# Patient Record
Sex: Male | Born: 1985
Health system: Southern US, Community
[De-identification: ages and names within clinical notes are randomized; demographics above are authoritative.]

---

## 2005-04-19 ENCOUNTER — Emergency Department (HOSPITAL_COMMUNITY): Admission: EM | Admit: 2005-04-19 | Discharge: 2005-04-19 | Payer: Self-pay | Admitting: Emergency Medicine

## 2006-07-29 IMAGING — CR DG WRIST COMPLETE 3+V*R*
3 series · 3 of 3 positions shown · non-contrast
Comparison: None.

CLINICAL DATA: Pain over the right anatomical snuffbox after trauma on 04/13/05.
 RIGHT WRIST ? 4 VIEWS:

[view not recorded (1 of 3)]
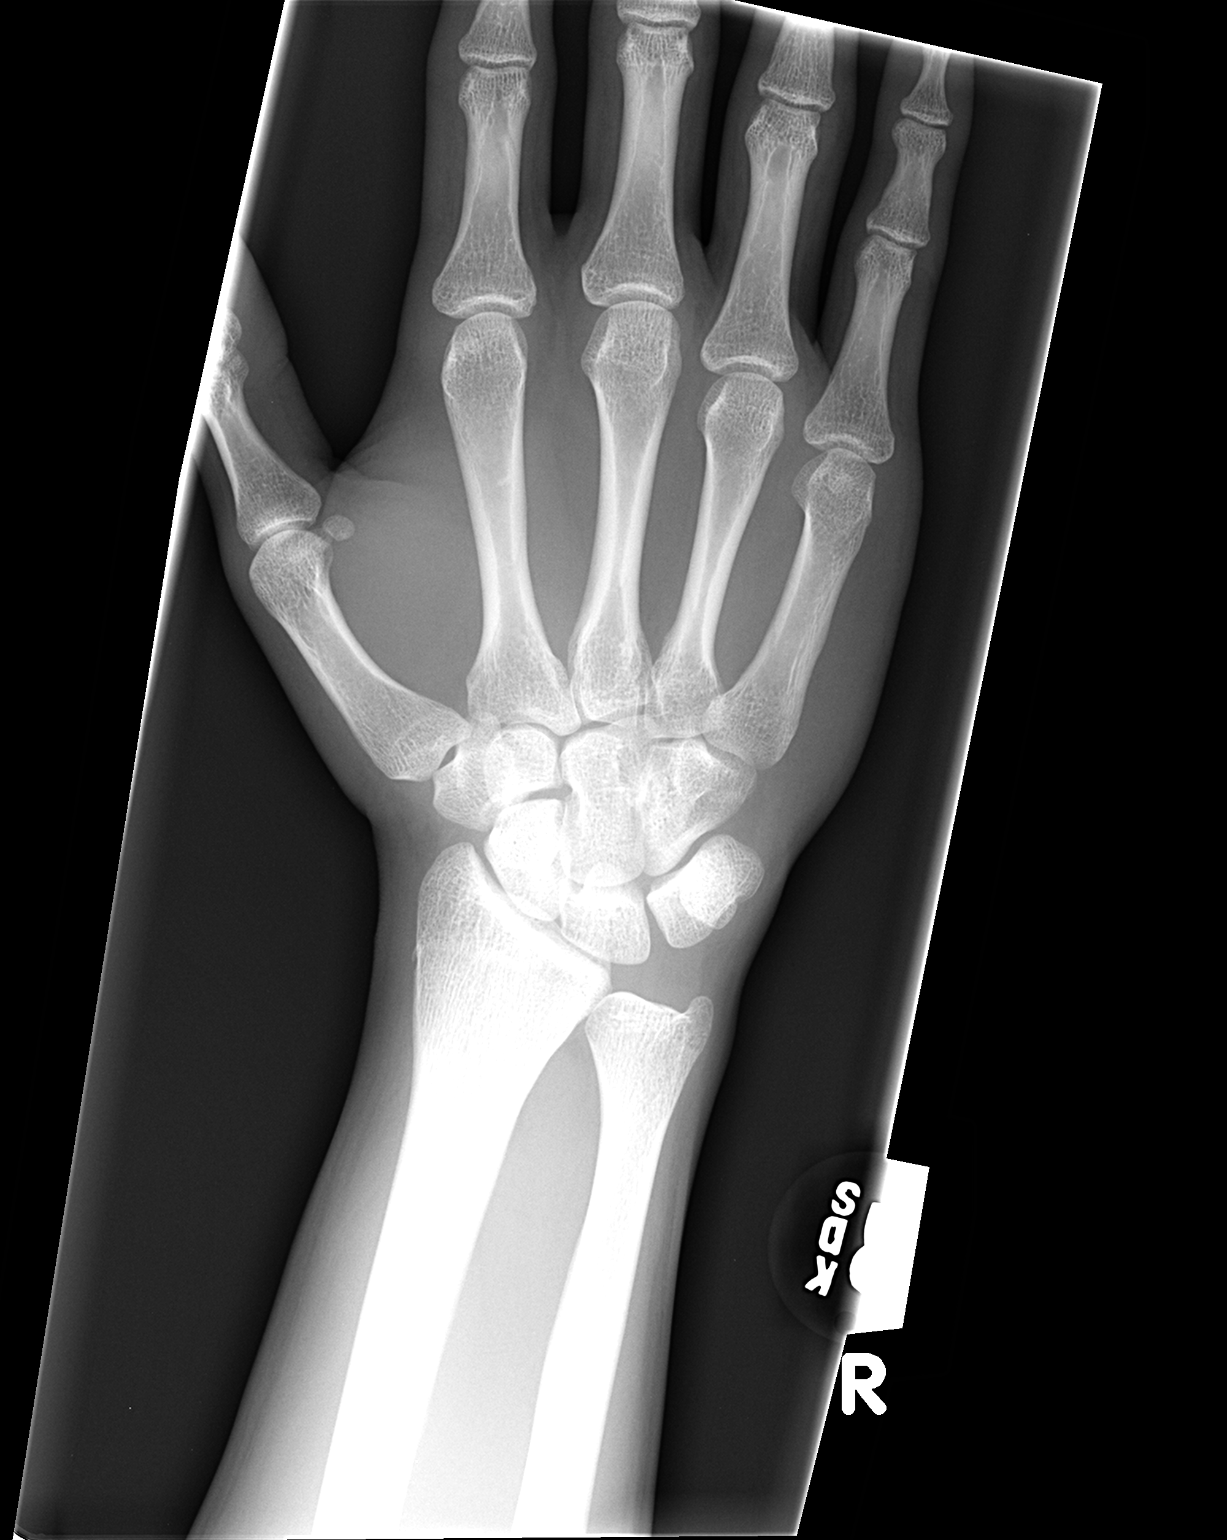

[view not recorded (2 of 3)]
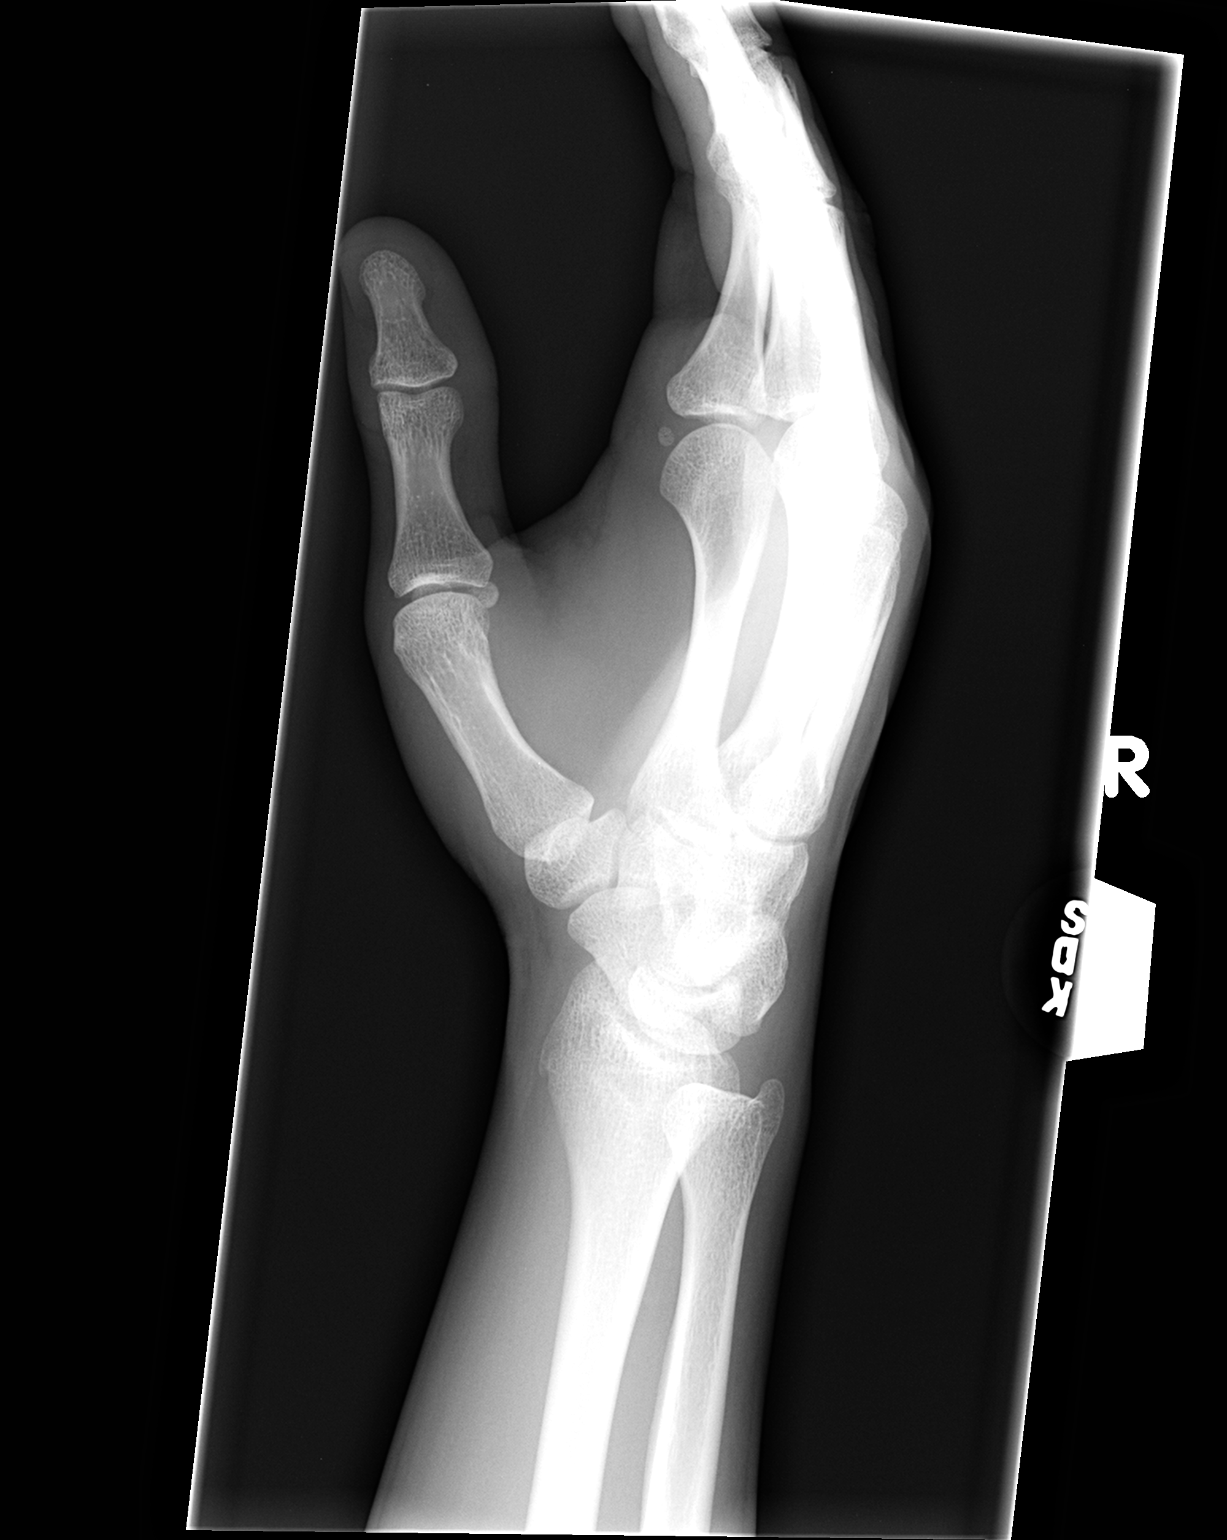

[view not recorded (3 of 3)]
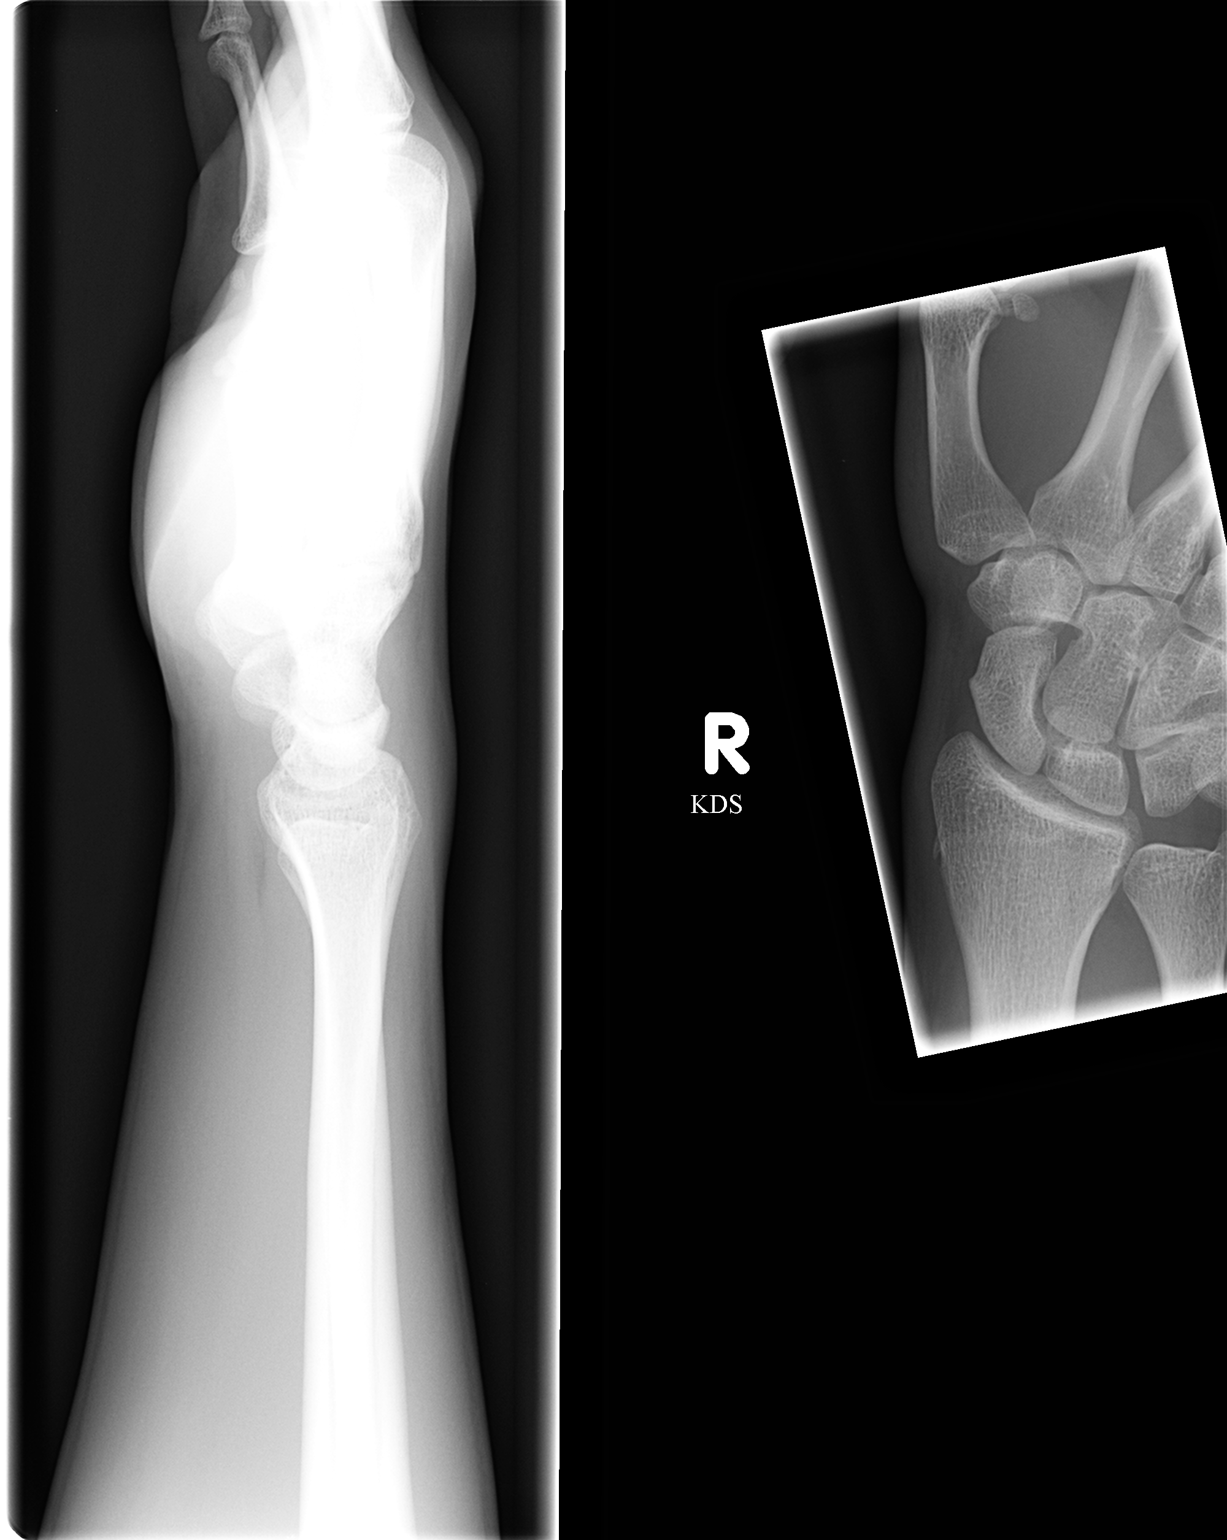

[3 of 3 positions shown; findings below may reference images not displayed]

FINDINGS: There is no evidence of fracture or dislocation.  There is no evidence of arthropathy or other focal bone abnormality.  Soft tissues are unremarkable.
IMPRESSION: Negative.

## 2009-11-17 ENCOUNTER — Emergency Department (HOSPITAL_COMMUNITY): Admission: EM | Admit: 2009-11-17 | Discharge: 2009-11-17 | Payer: Self-pay | Admitting: Emergency Medicine

## 2011-02-26 IMAGING — CR DG ANKLE COMPLETE 3+V*R*
3 series · 3 of 3 positions shown · non-contrast
Comparison: None.

CLINICAL DATA: Ankle pain and swelling following twisting injury.

RIGHT ANKLE - COMPLETE 3+ VIEW

[view not recorded (1 of 3)]
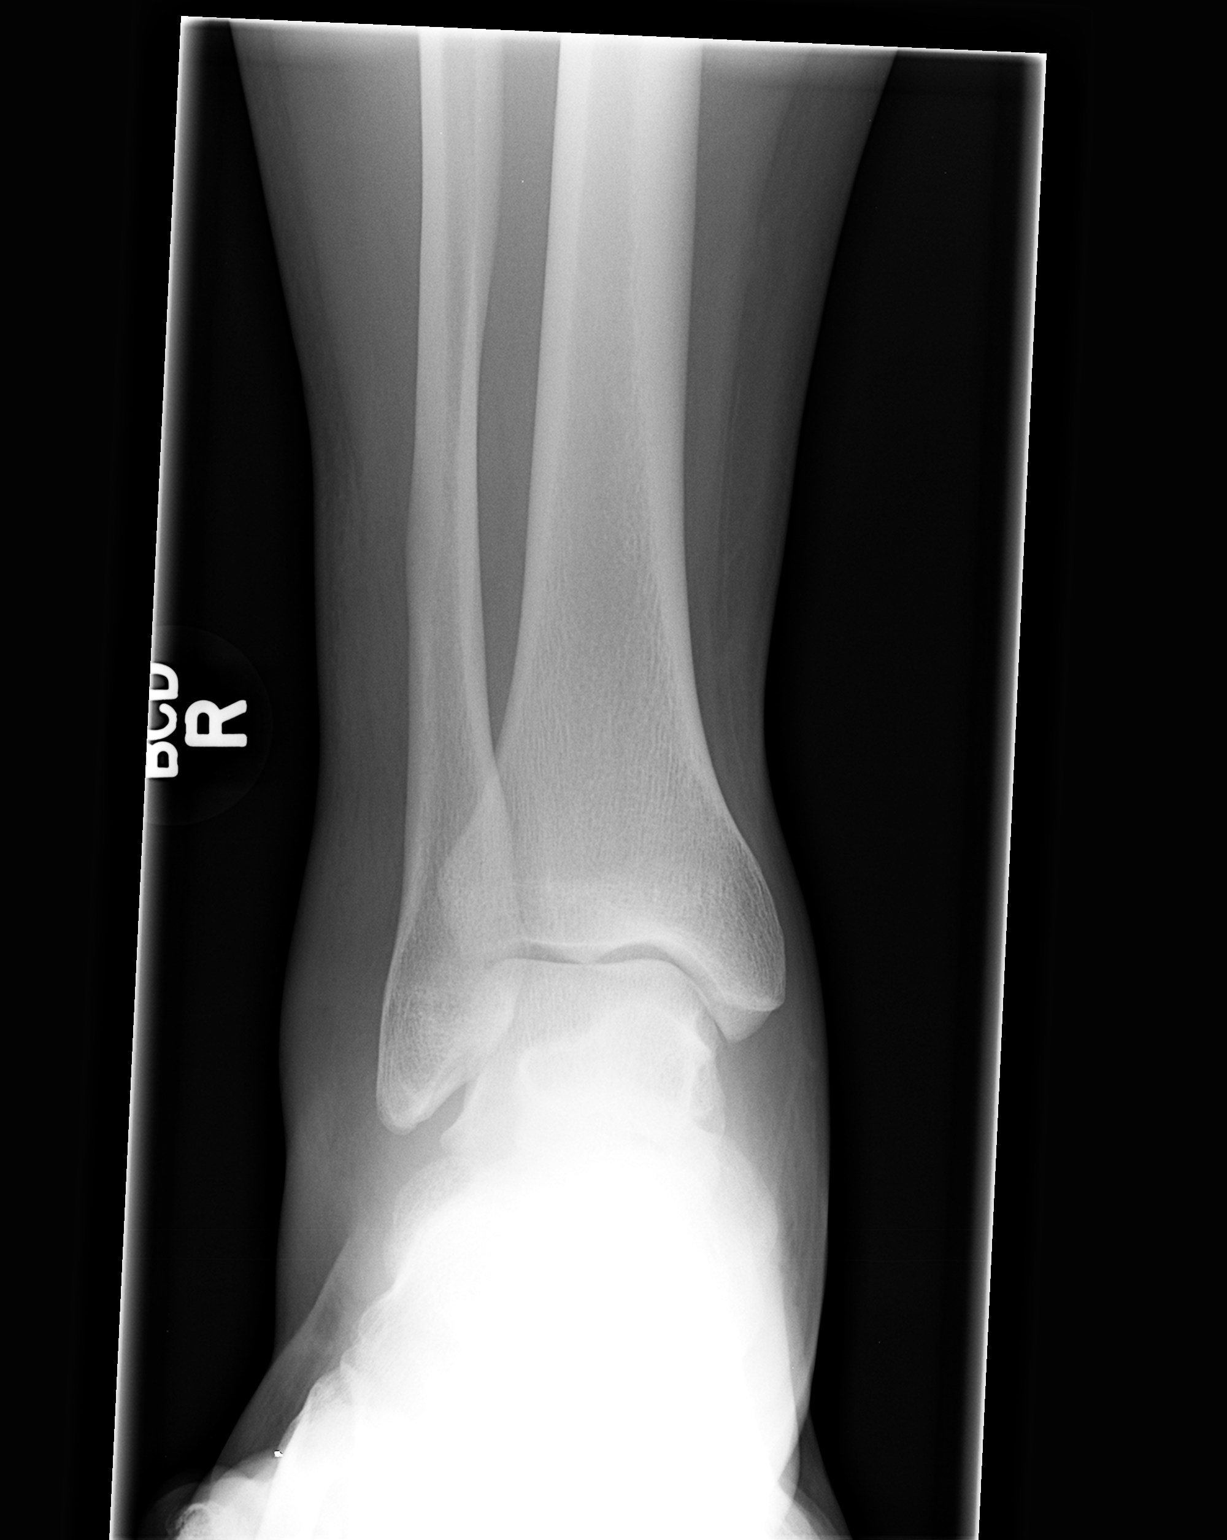

[view not recorded (2 of 3)]
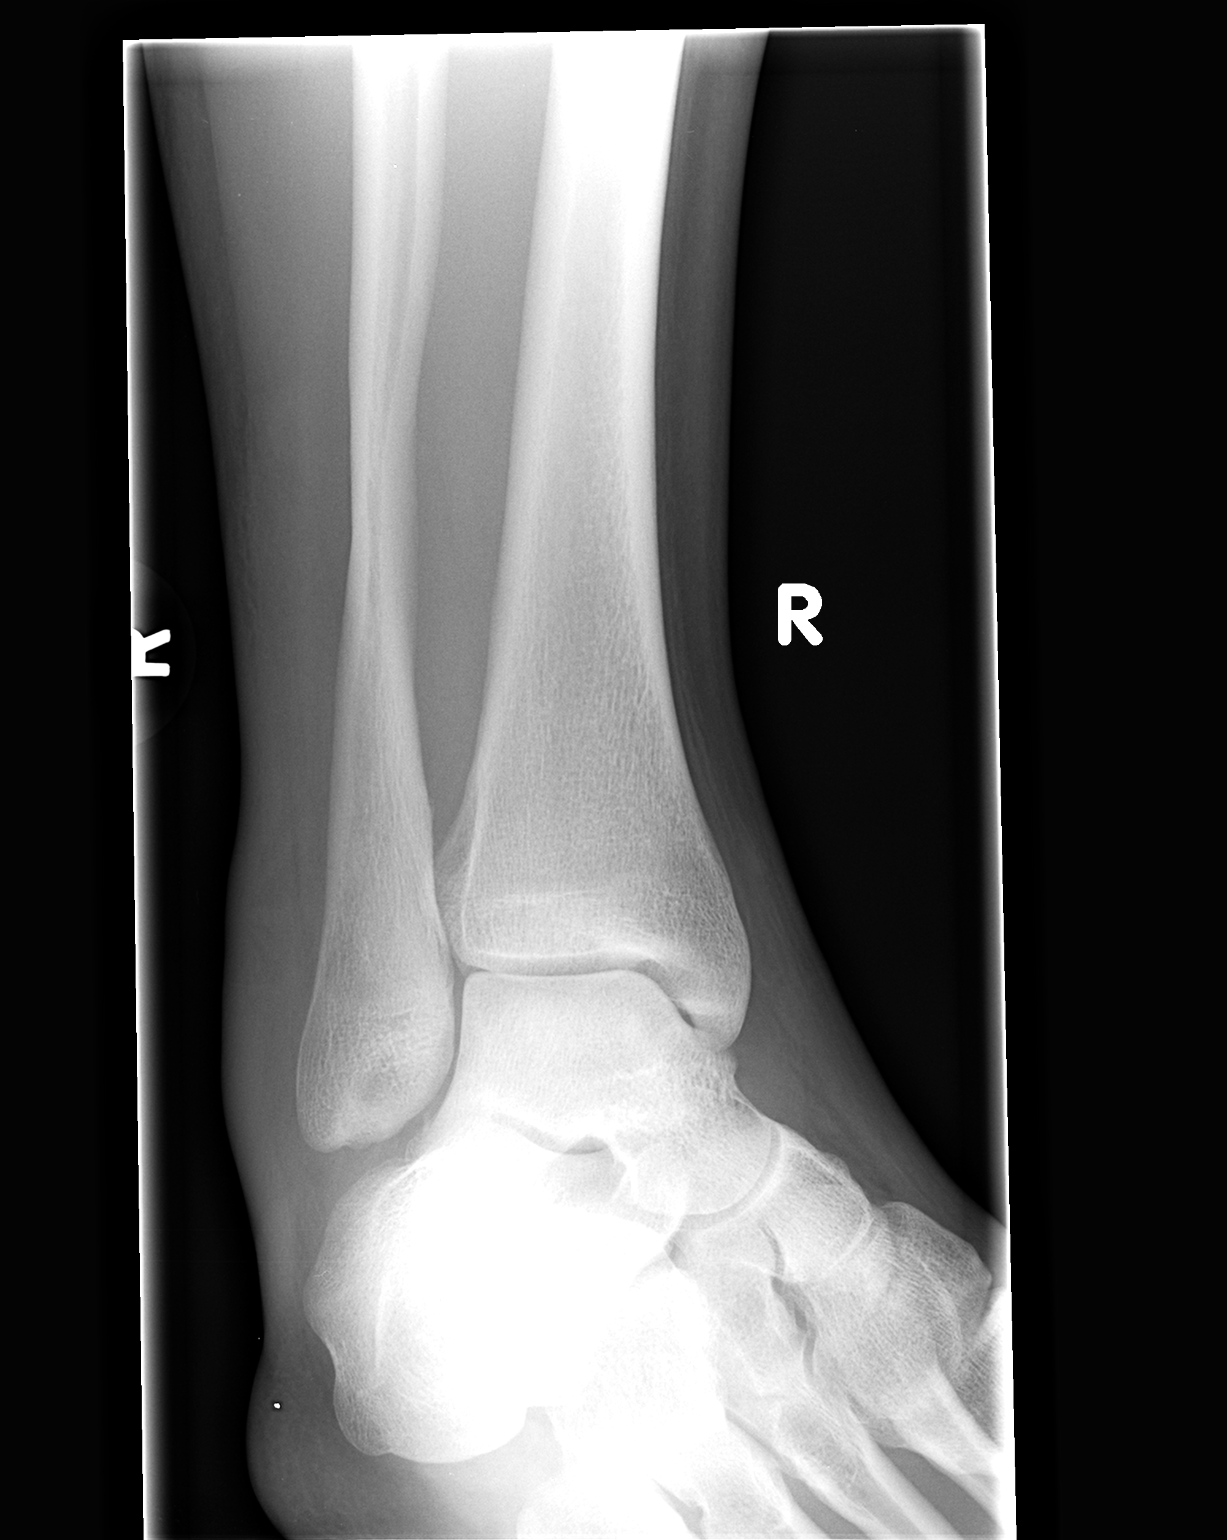

[view not recorded (3 of 3)]
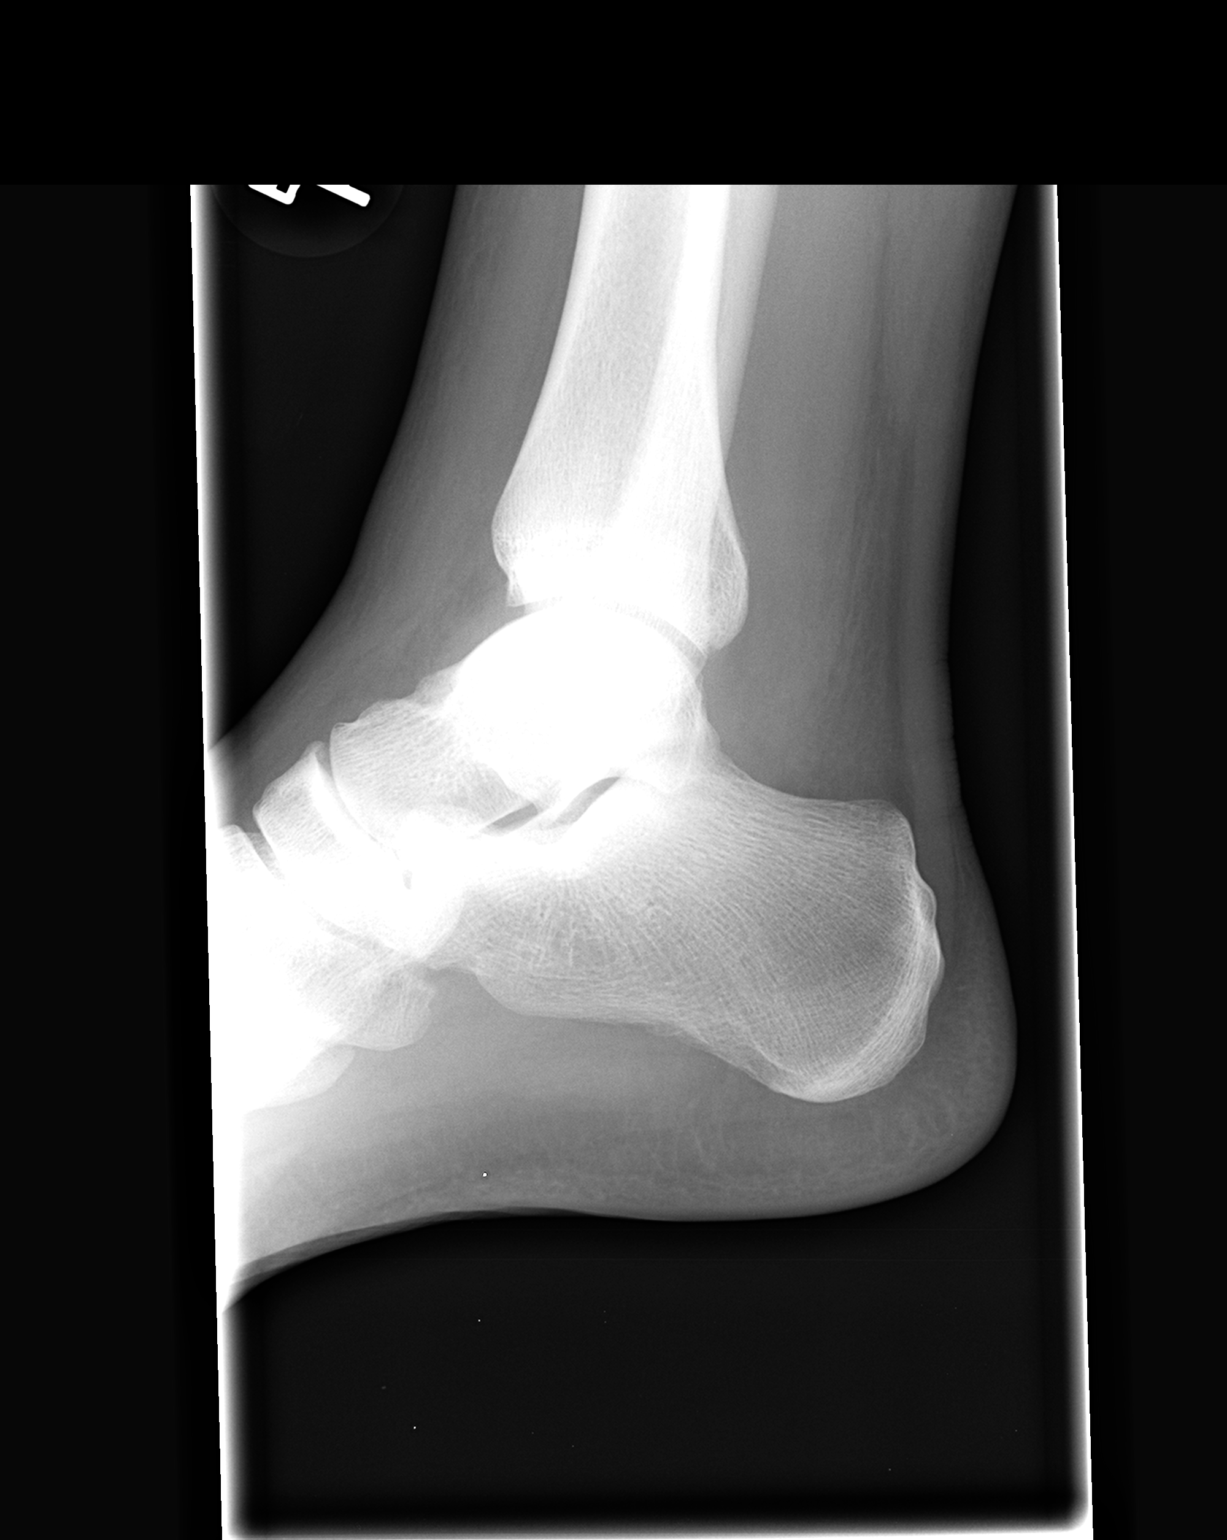

[3 of 3 positions shown; findings below may reference images not displayed]

FINDINGS: There is marked lateral soft tissue swelling.
Mineralization and alignment are normal.  There is no evidence of
acute fracture or dislocation.  There is no widening of the ankle
mortise.
IMPRESSION: Lateral soft tissue swelling.  No acute osseous findings.

## 2013-03-04 ENCOUNTER — Ambulatory Visit: Payer: Self-pay | Admitting: Gastroenterology

## 2013-03-04 ENCOUNTER — Telehealth: Payer: Self-pay | Admitting: Gastroenterology

## 2013-03-04 NOTE — Telephone Encounter (Signed)
Pt was a no show

## 2022-01-25 ENCOUNTER — Encounter: Payer: Self-pay | Admitting: Emergency Medicine

## 2022-01-25 ENCOUNTER — Observation Stay (HOSPITAL_COMMUNITY)
Admission: EM | Admit: 2022-01-25 | Discharge: 2022-01-26 | Disposition: A | Discharge status: Home / Self Care | Payer: 59 | Attending: General Surgery | Admitting: General Surgery

## 2022-01-25 ENCOUNTER — Ambulatory Visit
Admission: EM | Admit: 2022-01-25 | Discharge: 2022-01-25 | Disposition: A | Discharge status: Home / Self Care | Payer: 59 | Attending: Family Medicine | Admitting: Family Medicine

## 2022-01-25 ENCOUNTER — Other Ambulatory Visit: Payer: Self-pay

## 2022-01-25 ENCOUNTER — Encounter (HOSPITAL_COMMUNITY): Payer: Self-pay | Admitting: Emergency Medicine

## 2022-01-25 ENCOUNTER — Emergency Department (HOSPITAL_COMMUNITY): Payer: 59

## 2022-01-25 DIAGNOSIS — R1031 Right lower quadrant pain: Secondary | ICD-10-CM | POA: Diagnosis present

## 2022-01-25 DIAGNOSIS — Z87891 Personal history of nicotine dependence: Secondary | ICD-10-CM | POA: Insufficient documentation

## 2022-01-25 DIAGNOSIS — K3580 Unspecified acute appendicitis: Secondary | ICD-10-CM | POA: Diagnosis not present

## 2022-01-25 LAB — URINALYSIS, ROUTINE W REFLEX MICROSCOPIC
Bilirubin Urine: NEGATIVE
Glucose, UA: NEGATIVE mg/dL
Hgb urine dipstick: NEGATIVE
Ketones, ur: 80 mg/dL — AB
Leukocytes,Ua: NEGATIVE
Nitrite: NEGATIVE
Protein, ur: NEGATIVE mg/dL
Specific Gravity, Urine: 1.017 (ref 1.005–1.030)
pH: 6 (ref 5.0–8.0)

## 2022-01-25 LAB — CBC
HCT: 43.7 % (ref 39.0–52.0)
Hemoglobin: 15.4 g/dL (ref 13.0–17.0)
MCH: 31.9 pg (ref 26.0–34.0)
MCHC: 35.2 g/dL (ref 30.0–36.0)
MCV: 90.5 fL (ref 80.0–100.0)
Platelets: 219 10*3/uL (ref 150–400)
RBC: 4.83 MIL/uL (ref 4.22–5.81)
RDW: 11.5 % (ref 11.5–15.5)
WBC: 11 10*3/uL — ABNORMAL HIGH (ref 4.0–10.5)
nRBC: 0 % (ref 0.0–0.2)

## 2022-01-25 LAB — COMPREHENSIVE METABOLIC PANEL
ALT: 16 U/L (ref 0–44)
AST: 14 U/L — ABNORMAL LOW (ref 15–41)
Albumin: 4.2 g/dL (ref 3.5–5.0)
Alkaline Phosphatase: 71 U/L (ref 38–126)
Anion gap: 9 (ref 5–15)
BUN: 12 mg/dL (ref 6–20)
CO2: 25 mmol/L (ref 22–32)
Calcium: 9.1 mg/dL (ref 8.9–10.3)
Chloride: 103 mmol/L (ref 98–111)
Creatinine, Ser: 1.05 mg/dL (ref 0.61–1.24)
GFR, Estimated: >60 mL/min (ref >60)
Glucose, Bld: 86 mg/dL (ref 70–99)
Potassium: 3.7 mmol/L (ref 3.5–5.1)
Sodium: 137 mmol/L (ref 135–145)
Total Bilirubin: 1.4 mg/dL — ABNORMAL HIGH (ref 0.3–1.2)
Total Protein: 7.7 g/dL (ref 6.5–8.1)

## 2022-01-25 LAB — LIPASE, BLOOD: Lipase: 26 U/L (ref 11–51)

## 2022-01-25 MED ORDER — METRONIDAZOLE 500 MG/100ML IV SOLN
500.0000 mg | Freq: Once | INTRAVENOUS | Status: AC
  Administered 2022-01-25: 500 mg via INTRAVENOUS
  Filled 2022-01-25: qty 100

## 2022-01-25 MED ORDER — ONDANSETRON HCL 4 MG/2ML IJ SOLN: 4.0000 mg | Freq: Three times a day (TID) | INTRAMUSCULAR | Status: AC | PRN

## 2022-01-25 MED ORDER — HYDROMORPHONE HCL 1 MG/ML IJ SOLN: 0.5000 mg | INTRAMUSCULAR | Status: DC | PRN

## 2022-01-25 MED ORDER — SODIUM CHLORIDE 0.9 % IV BOLUS
1000.0000 mL | Freq: Once | INTRAVENOUS | Status: AC
  Administered 2022-01-25: 1000 mL via INTRAVENOUS

## 2022-01-25 MED ORDER — SODIUM CHLORIDE 0.9 % IV SOLN
2.0000 g | Freq: Once | INTRAVENOUS | Status: AC
  Administered 2022-01-25: 2 g via INTRAVENOUS
  Filled 2022-01-25: qty 20

## 2022-01-25 MED ORDER — IOHEXOL 300 MG/ML  SOLN
100.0000 mL | Freq: Once | INTRAMUSCULAR | Status: AC | PRN
  Administered 2022-01-25: 100 mL via INTRAVENOUS

## 2022-01-25 MED ORDER — LACTATED RINGERS IV SOLN: INTRAVENOUS | Status: DC

## 2022-01-25 NOTE — ED Triage Notes (Signed)
Upper ABD pain yesterday.  Pain now at right lower ABD.  Area tender to the touch.  Denies nausea, vomiting and diarrhea.  Last normal BM was yesterday.  Pain worse with movement.

## 2022-01-25 NOTE — Discharge Instructions (Signed)
You have been seen today for abdominal pain. As we discussed, I cannot rule out appendicitis here. Therefore, it is very important for you to pay attention to any new symptoms or worsening of your current condition.  Please return here or to the Emergency Department immediately should you begin to feel worse in any way or have any of the following symptoms: increasing or different abdominal pain, persistent vomiting, inability to drink fluids, fevers, or shaking chills.

## 2022-01-25 NOTE — ED Triage Notes (Signed)
Pt sent from urgent care to rule out appendicitis. Pt has been having right lower abd pain since yesterday.

## 2022-01-25 NOTE — Progress Notes (Signed)
[facility]  Patient with acute appendicitis on CT . NPO at midnight. Dr. Particia Nearing helped by putting in orders for admission and PRN. Pain meds, and antibiotics. Much appreciated.  Will see Patient in the a.m. and discuss options .  Algis Greenhouse, MD

## 2022-01-25 NOTE — ED Provider Notes (Signed)
[facility] EMERGENCY DEPARTMENT Provider Note   CSN: [id number] Arrival date & time: 01/25/22  1850     History  Chief Complaint  Patient presents with   Abdominal Pain    Roberto Bullock is a 36 y.o. male.  Pt is a 36 yo male with no significant pmhx.  He has been having RLQ pain since yesterday evening.  No other sx.  He initially went to UC, but was sent here for further eval.        Home Medications Prior to Admission medications   Medication Sig Start Date End Date Taking? Authorizing Provider  ibuprofen (ADVIL) 200 MG tablet Take 600 mg by mouth every 6 (six) hours as needed for moderate pain.   Yes [provider]      Allergies    Patient has no known allergies.    Review of Systems   Review of Systems  Gastrointestinal:  Positive for abdominal pain.  All other systems reviewed and are negative.   Physical Exam Updated Vital Signs BP 133/84   Pulse 77   Temp 98.6 F (37 C) (Oral)   Resp 16   Ht 6' (1.829 m)   Wt 93 kg   SpO2 100%   BMI 27.80 kg/m  Physical Exam Vitals and nursing note reviewed.  Constitutional:      Appearance: He is well-developed.  HENT:     Head: Normocephalic and atraumatic.     Mouth/Throat:     Mouth: Mucous membranes are moist.     Pharynx: Oropharynx is clear.  Eyes:     Extraocular Movements: Extraocular movements intact.     Pupils: Pupils are equal, round, and reactive to light.  Cardiovascular:     Rate and Rhythm: Normal rate and regular rhythm.  Pulmonary:     Effort: Pulmonary effort is normal.     Breath sounds: Normal breath sounds.  Abdominal:     General: Abdomen is flat. Bowel sounds are normal.     Palpations: Abdomen is soft.     Tenderness: There is abdominal tenderness in the right lower quadrant.  Skin:    General: Skin is warm.     Capillary Refill: Capillary refill takes less than 2 seconds.  Neurological:     General: No focal deficit present.     Mental Status: He is alert and  oriented to person, place, and time.  Psychiatric:        Mood and Affect: Mood normal.        Behavior: Behavior normal.     ED Results / Procedures / Treatments   Labs (all labs ordered are listed, but only abnormal results are displayed) Labs Reviewed  COMPREHENSIVE METABOLIC PANEL - Abnormal; Notable for the following components:      Result Value   AST 14 (*)    Total Bilirubin 1.4 (*)    All other components within normal limits  CBC - Abnormal; Notable for the following components:   WBC 11.0 (*)    All other components within normal limits  URINALYSIS, ROUTINE W REFLEX MICROSCOPIC - Abnormal; Notable for the following components:   Ketones, ur 80 (*)    All other components within normal limits  LIPASE, BLOOD    EKG None  Radiology CT ABDOMEN PELVIS W CONTRAST  Result Date: 01/25/2022 CLINICAL DATA:  Right lower quadrant pain for 2 days, initial encounter EXAM: CT ABDOMEN AND PELVIS WITH CONTRAST TECHNIQUE: Multidetector CT imaging of the abdomen and pelvis was  performed using the standard protocol following bolus administration of intravenous contrast. RADIATION DOSE REDUCTION: This exam was performed according to the departmental dose-optimization program which includes automated exposure control, adjustment of the mA and/or kV according to patient size and/or use of iterative reconstruction technique. CONTRAST:  OMNIPAQUE IOHEXOL 300 MG/ML  SOLN COMPARISON:  None Available. FINDINGS: Lower chest: No acute abnormality. Hepatobiliary: No focal liver abnormality is seen. No gallstones, gallbladder wall thickening, or biliary dilatation. Pancreas: Unremarkable. No pancreatic ductal dilatation or surrounding inflammatory changes. Spleen: Normal in size without focal abnormality. Adrenals/Urinary Tract: Adrenal glands are within normal limits. Kidneys demonstrate a normal enhancement pattern bilaterally. No renal calculi are seen. The ureters are within normal limits. The  bladder is well distended. Stomach/Bowel: No obstructive or inflammatory changes of the colon are noted. The appendix is hyperemic and significantly dilated to 14 mm. Peri appendiceal inflammatory changes are seen consistent with acute appendicitis. No perforation or abscess is noted at this time. Small bowel and stomach are within normal limits. Vascular/Lymphatic: No significant vascular findings are present. No enlarged abdominal or pelvic lymph nodes. Reproductive: Prostate is within normal limits. Other: No abdominal wall hernia or abnormality. No abdominopelvic ascites. Musculoskeletal: No acute or significant osseous findings. IMPRESSION: Changes consistent with acute appendicitis without perforation or abscess formation. Electronically Signed   By: Alcide Clever M.D.   On: 01/25/2022 21:37    Procedures Procedures    Medications Ordered in ED Medications  cefTRIAXone (ROCEPHIN) 2 g in sodium chloride 0.9 % 100 mL IVPB (2 g Intravenous New Bag/Given 01/25/22 2153)    And  metroNIDAZOLE (FLAGYL) IVPB 500 mg (has no administration in time range)  sodium chloride 0.9 % bolus 1,000 mL (0 mLs Intravenous Stopped 01/25/22 2146)  iohexol (OMNIPAQUE) 300 MG/ML solution 100 mL (100 mLs Intravenous Contrast Given 01/25/22 2116)    ED Course/ Medical Decision Making/ A&P                           Medical Decision Making Amount and/or Complexity of Data Reviewed Labs: ordered. Radiology: ordered.  Risk Prescription drug management. Decision regarding hospitalization.   This patient presents to the ED for concern of abd pain, this involves an extensive number of treatment options, and is a complaint that carries with it a high risk of complications and morbidity.  The differential diagnosis includes acute appendicitis, kidney stone, infection   Co morbidities that complicate the patient evaluation  none   Additional history obtained:  Additional history obtained from epic chart  review External records from outside source obtained and reviewed including wife   Lab Tests:  I Ordered, and personally interpreted labs.  The pertinent results include:  cbc with wbc elevated at 11; ua with ketones; cmp nl; lip nl   Imaging Studies ordered:  I ordered imaging studies including ct abd/pelvis  I independently visualized and interpreted imaging which showed  IMPRESSION:  Changes consistent with acute appendicitis without perforation or  abscess formation.   I agree with the radiologist interpretation   Cardiac Monitoring:  The patient was maintained on a cardiac monitor.  I personally viewed and interpreted the cardiac monitored which showed an underlying rhythm of: nsr   Medicines ordered and prescription drug management:  I ordered medication including IVFs  for dehydration  Reevaluation of the patient after these medicines showed that the patient improved I have reviewed the patients home medicines and have made adjustments as needed  Test Considered:  ct   Critical Interventions:  abx   Consultations Obtained:  I requested consultation with the surgeon (Dr. Henreitta Leber),  and discussed lab and imaging findings as well as pertinent plan - she will admit  Problem List / ED Course:  Acute appendicitis:  pt started on iv abx.  He has not wanted any pain meds.  Dr. Henreitta Leber will admit with plan for OR tomorrow.   Reevaluation:  After the interventions noted above, I reevaluated the patient and found that they have :improved   Social Determinants of Health:  Lives at home   Dispostion:  After consideration of the diagnostic results and the patients response to treatment, I feel that the patent would benefit from admission.          Final Clinical Impression(s) / ED Diagnoses Final diagnoses:  Acute appendicitis, unspecified acute appendicitis type    Rx / DC Orders ED Discharge Orders     None         Jacalyn Lefevre,  MD 01/25/22 2158

## 2022-01-26 ENCOUNTER — Other Ambulatory Visit: Payer: Self-pay

## 2022-01-26 ENCOUNTER — Encounter: Payer: Self-pay | Admitting: *Deleted

## 2022-01-26 ENCOUNTER — Encounter (HOSPITAL_COMMUNITY)
Admission: EM | Disposition: A | Discharge status: Home / Self Care | Payer: Self-pay | Source: Home / Self Care | Attending: Emergency Medicine

## 2022-01-26 ENCOUNTER — Encounter (HOSPITAL_COMMUNITY): Payer: Self-pay | Admitting: General Surgery

## 2022-01-26 ENCOUNTER — Observation Stay (HOSPITAL_BASED_OUTPATIENT_CLINIC_OR_DEPARTMENT_OTHER): Payer: 59 | Admitting: Certified Registered"

## 2022-01-26 ENCOUNTER — Observation Stay (HOSPITAL_COMMUNITY): Payer: 59 | Admitting: Certified Registered"

## 2022-01-26 DIAGNOSIS — K353 Acute appendicitis with localized peritonitis, without perforation or gangrene: Secondary | ICD-10-CM | POA: Diagnosis not present

## 2022-01-26 DIAGNOSIS — K3580 Unspecified acute appendicitis: Secondary | ICD-10-CM

## 2022-01-26 HISTORY — PX: LAPAROSCOPIC APPENDECTOMY: SHX408

## 2022-01-26 LAB — SURGICAL PCR SCREEN
MRSA, PCR: NEGATIVE
Staphylococcus aureus: NEGATIVE

## 2022-01-26 SURGERY — APPENDECTOMY, LAPAROSCOPIC
Anesthesia: General | Site: Abdomen

## 2022-01-26 MED ORDER — ONDANSETRON HCL 4 MG/2ML IJ SOLN
INTRAMUSCULAR | Status: DC | PRN
  Administered 2022-01-26: 4 mg via INTRAVENOUS

## 2022-01-26 MED ORDER — DEXAMETHASONE SODIUM PHOSPHATE 10 MG/ML IJ SOLN
INTRAMUSCULAR | Status: DC | PRN
  Administered 2022-01-26: 5 mg via INTRAVENOUS

## 2022-01-26 MED ORDER — PROPOFOL 10 MG/ML IV BOLUS
INTRAVENOUS | Status: AC
  Filled 2022-01-26: qty 20

## 2022-01-26 MED ORDER — SODIUM CHLORIDE 0.9 % IV SOLN
2.0000 g | INTRAVENOUS | Status: AC
  Administered 2022-01-26: 2 g via INTRAVENOUS
  Filled 2022-01-26: qty 2

## 2022-01-26 MED ORDER — ROCURONIUM BROMIDE 100 MG/10ML IV SOLN
INTRAVENOUS | Status: DC | PRN
  Administered 2022-01-26: 60 mg via INTRAVENOUS

## 2022-01-26 MED ORDER — HYDROMORPHONE HCL 1 MG/ML IJ SOLN
INTRAMUSCULAR | Status: AC
  Filled 2022-01-26: qty 0.5

## 2022-01-26 MED ORDER — HYDROMORPHONE HCL 1 MG/ML IJ SOLN: 0.2500 mg | INTRAMUSCULAR | Status: DC | PRN

## 2022-01-26 MED ORDER — DEXMEDETOMIDINE (PRECEDEX) IN NS 20 MCG/5ML (4 MCG/ML) IV SYRINGE
PREFILLED_SYRINGE | INTRAVENOUS | Status: DC | PRN
  Administered 2022-01-26 (×3): 4 ug via INTRAVENOUS

## 2022-01-26 MED ORDER — OXYCODONE HCL 5 MG PO TABS: 5.0000 mg | ORAL_TABLET | ORAL | 0 refills | Status: AC | PRN

## 2022-01-26 MED ORDER — KETOROLAC TROMETHAMINE 30 MG/ML IJ SOLN
INTRAMUSCULAR | Status: AC
  Filled 2022-01-26: qty 1

## 2022-01-26 MED ORDER — SUGAMMADEX SODIUM 200 MG/2ML IV SOLN
INTRAVENOUS | Status: DC | PRN
  Administered 2022-01-26: 200 mg via INTRAVENOUS

## 2022-01-26 MED ORDER — HYDROMORPHONE HCL 1 MG/ML IJ SOLN
INTRAMUSCULAR | Status: DC | PRN
  Administered 2022-01-26: .5 mg via INTRAVENOUS

## 2022-01-26 MED ORDER — KETOROLAC TROMETHAMINE 30 MG/ML IJ SOLN
INTRAMUSCULAR | Status: DC | PRN
  Administered 2022-01-26: 30 mg via INTRAVENOUS

## 2022-01-26 MED ORDER — DEXAMETHASONE SODIUM PHOSPHATE 10 MG/ML IJ SOLN
INTRAMUSCULAR | Status: AC
  Filled 2022-01-26: qty 1

## 2022-01-26 MED ORDER — ONDANSETRON HCL 4 MG PO TABS: 4.0000 mg | ORAL_TABLET | Freq: Three times a day (TID) | ORAL | 1 refills | Status: AC | PRN

## 2022-01-26 MED ORDER — BUPIVACAINE LIPOSOME 1.3 % IJ SUSP
INTRAMUSCULAR | Status: DC | PRN
  Administered 2022-01-26: 20 mL

## 2022-01-26 MED ORDER — LIDOCAINE 2% (20 MG/ML) 5 ML SYRINGE
INTRAMUSCULAR | Status: DC | PRN
  Administered 2022-01-26: 100 mg via INTRAVENOUS

## 2022-01-26 MED ORDER — MIDAZOLAM HCL 2 MG/2ML IJ SOLN
INTRAMUSCULAR | Status: AC
  Filled 2022-01-26: qty 2

## 2022-01-26 MED ORDER — LIDOCAINE HCL (PF) 2 % IJ SOLN
INTRAMUSCULAR | Status: AC
  Filled 2022-01-26: qty 5

## 2022-01-26 MED ORDER — FENTANYL CITRATE (PF) 100 MCG/2ML IJ SOLN
INTRAMUSCULAR | Status: DC | PRN
  Administered 2022-01-26: 50 ug via INTRAVENOUS
  Administered 2022-01-26: 100 ug via INTRAVENOUS

## 2022-01-26 MED ORDER — OXYCODONE HCL 5 MG PO TABS: 5.0000 mg | ORAL_TABLET | ORAL | Status: DC | PRN

## 2022-01-26 MED ORDER — LACTATED RINGERS IV SOLN: INTRAVENOUS | Status: DC

## 2022-01-26 MED ORDER — ONDANSETRON HCL 4 MG/2ML IJ SOLN
INTRAMUSCULAR | Status: AC
  Filled 2022-01-26: qty 4

## 2022-01-26 MED ORDER — ORAL CARE MOUTH RINSE: 15.0000 mL | Freq: Once | OROMUCOSAL | Status: AC

## 2022-01-26 MED ORDER — ROCURONIUM BROMIDE 10 MG/ML (PF) SYRINGE
PREFILLED_SYRINGE | INTRAVENOUS | Status: AC
  Filled 2022-01-26: qty 10

## 2022-01-26 MED ORDER — MEPERIDINE HCL 50 MG/ML IJ SOLN: 6.2500 mg | INTRAMUSCULAR | Status: DC | PRN

## 2022-01-26 MED ORDER — DEXMEDETOMIDINE HCL IN NACL 80 MCG/20ML IV SOLN
INTRAVENOUS | Status: AC
  Filled 2022-01-26: qty 20

## 2022-01-26 MED ORDER — FENTANYL CITRATE (PF) 250 MCG/5ML IJ SOLN
INTRAMUSCULAR | Status: AC
  Filled 2022-01-26: qty 5

## 2022-01-26 MED ORDER — CHLORHEXIDINE GLUCONATE 0.12 % MT SOLN
15.0000 mL | Freq: Once | OROMUCOSAL | Status: AC
  Administered 2022-01-26: 15 mL via OROMUCOSAL

## 2022-01-26 MED ORDER — PROPOFOL 10 MG/ML IV BOLUS
INTRAVENOUS | Status: DC | PRN
  Administered 2022-01-26: 200 mg via INTRAVENOUS

## 2022-01-26 MED ORDER — MORPHINE SULFATE (PF) 2 MG/ML IV SOLN: 2.0000 mg | INTRAVENOUS | Status: DC | PRN

## 2022-01-26 MED ORDER — CHLORHEXIDINE GLUCONATE 4 % EX LIQD: 1.0000 | Freq: Once | CUTANEOUS | Status: DC

## 2022-01-26 MED ORDER — MIDAZOLAM HCL 5 MG/5ML IJ SOLN
INTRAMUSCULAR | Status: DC | PRN
  Administered 2022-01-26: 2 mg via INTRAVENOUS

## 2022-01-26 MED ORDER — BUPIVACAINE LIPOSOME 1.3 % IJ SUSP
INTRAMUSCULAR | Status: AC
  Filled 2022-01-26: qty 20

## 2022-01-26 MED ORDER — SODIUM CHLORIDE 0.9 % IR SOLN
Status: DC | PRN
  Administered 2022-01-26: 1000 mL

## 2022-01-26 SURGICAL SUPPLY — 48 items
ADH SKN CLS APL DERMABOND .7 (GAUZE/BANDAGES/DRESSINGS) ×1
APL PRP STRL LF DISP 70% ISPRP (MISCELLANEOUS) ×1
BLADE SURG 15 STRL LF DISP TIS (BLADE) ×1 IMPLANT
BLADE SURG 15 STRL SS (BLADE) ×1
CHLORAPREP W/TINT 26 (MISCELLANEOUS) ×1 IMPLANT
CLOTH BEACON ORANGE TIMEOUT ST (SAFETY) ×1 IMPLANT
COVER LIGHT HANDLE STERIS (MISCELLANEOUS) ×2 IMPLANT
CUTTER FLEX LINEAR 45M (STAPLE) ×1 IMPLANT
DERMABOND ADVANCED (GAUZE/BANDAGES/DRESSINGS) ×1
DERMABOND ADVANCED .7 DNX12 (GAUZE/BANDAGES/DRESSINGS) ×1 IMPLANT
ELECT REM PT RETURN 9FT ADLT (ELECTROSURGICAL) ×1
ELECTRODE REM PT RTRN 9FT ADLT (ELECTROSURGICAL) ×1 IMPLANT
GLOVE BIO SURGEON STRL SZ 6.5 (GLOVE) ×1 IMPLANT
GLOVE BIOGEL PI IND STRL 6.5 (GLOVE) ×1 IMPLANT
GLOVE BIOGEL PI IND STRL 7.0 (GLOVE) ×2 IMPLANT
GLOVE BIOGEL PI INDICATOR 6.5 (GLOVE) ×1
GLOVE BIOGEL PI INDICATOR 7.0 (GLOVE) ×2
GOWN STRL REUS W/TWL LRG LVL3 (GOWN DISPOSABLE) ×2 IMPLANT
INST SET LAPROSCOPIC AP (KITS) ×1 IMPLANT
KIT TURNOVER KIT A (KITS) ×1 IMPLANT
MANIFOLD NEPTUNE II (INSTRUMENTS) ×1 IMPLANT
NDL HYPO 18GX1.5 BLUNT FILL (NEEDLE) ×1 IMPLANT
NDL HYPO 21X1.5 SAFETY (NEEDLE) ×1 IMPLANT
NDL INSUFFLATION 14GA 120MM (NEEDLE) ×1 IMPLANT
NEEDLE HYPO 18GX1.5 BLUNT FILL (NEEDLE) ×1 IMPLANT
NEEDLE HYPO 21X1.5 SAFETY (NEEDLE) ×1 IMPLANT
NEEDLE INSUFFLATION 14GA 120MM (NEEDLE) ×1 IMPLANT
NS IRRIG 1000ML POUR BTL (IV SOLUTION) ×1 IMPLANT
PACK LAP CHOLE LZT030E (CUSTOM PROCEDURE TRAY) ×1 IMPLANT
PAD ARMBOARD 7.5X6 YLW CONV (MISCELLANEOUS) ×1 IMPLANT
PENCIL HANDSWITCHING (ELECTRODE) IMPLANT
RELOAD STAPLE 45 3.5 BLU ETS (ENDOMECHANICALS) IMPLANT
RELOAD STAPLE TA45 3.5 REG BLU (ENDOMECHANICALS) ×1 IMPLANT
SET BASIN LINEN APH (SET/KITS/TRAYS/PACK) ×1 IMPLANT
SET TUBE SMOKE EVAC HIGH FLOW (TUBING) ×1 IMPLANT
SHEARS HARMONIC ACE PLUS 36CM (ENDOMECHANICALS) ×1 IMPLANT
SUT MNCRL AB 4-0 PS2 18 (SUTURE) ×2 IMPLANT
SUT VICRYL 0 UR6 27IN ABS (SUTURE) ×1 IMPLANT
SYR 20ML LL LF (SYRINGE) ×2 IMPLANT
SYS BAG RETRIEVAL 10MM (BASKET) ×1
SYSTEM BAG RETRIEVAL 10MM (BASKET) ×1 IMPLANT
TRAY FOLEY W/BAG SLVR 16FR (SET/KITS/TRAYS/PACK) ×1
TRAY FOLEY W/BAG SLVR 16FR ST (SET/KITS/TRAYS/PACK) ×1 IMPLANT
TROCAR Z-THAD FIOS HNDL 12X100 (TROCAR) ×1 IMPLANT
TROCAR Z-THRD FIOS HNDL 11X100 (TROCAR) ×1 IMPLANT
TROCAR Z-THREAD FIOS 5X100MM (TROCAR) ×1 IMPLANT
TUBE CONNECTING 12X1/4 (SUCTIONS) ×1 IMPLANT
WARMER LAPAROSCOPE (MISCELLANEOUS) ×1 IMPLANT

## 2022-01-26 NOTE — ED Provider Notes (Signed)
  [facility]   [id number] 01/25/22 Arrival Time: 1736  ASSESSMENT & PLAN:  1. Right lower quadrant abdominal pain    Concern for appendicitis. He is hesitant to go to ED tonight to r/o. Will leave the decision to him. Abd pain precautions given. My recommendation is to go this evening for CT. He voices understanding.    Discharge Instructions      You have been seen today for abdominal pain. As we discussed, I cannot rule out appendicitis here. Therefore, it is very important for you to pay attention to any new symptoms or worsening of your current condition.  Please return here or to the Emergency Department immediately should you begin to feel worse in any way or have any of the following symptoms: increasing or different abdominal pain, persistent vomiting, inability to drink fluids, fevers, or shaking chills.       Reviewed expectations re: course of current medical issues. Questions answered. Outlined signs and symptoms indicating need for more acute intervention. Patient verbalized understanding. After Visit Summary given.   SUBJECTIVE: History from: patient. Roberto Bullock is a 36 y.o. male who presents with complaint of vague abd pain; initially epigastric yesterday; now RLQ. Decreased appetite today. No emesis. No significant nausea. Afebrile. Ambulatory. Normal bowel/bladder habits. No tx PTA.  History reviewed. No pertinent surgical history.   OBJECTIVE:  Vitals:   01/25/22 1748  BP: 115/72  Pulse: 89  Resp: 18  Temp: 98.4 F (36.9 C)  TempSrc: Oral  SpO2: 98%    General appearance: alert, oriented, no acute distress HEENT: [place]; AT; oropharynx moist Lungs: unlabored respirations Abdomen: soft; without distention; mild  TTP over RLQ; normal bowel sounds; without masses or organomegaly; without guarding or rebound tenderness Back: without reported CVA tenderness; FROM at waist Extremities: without LE edema; symmetrical; without gross  deformities Skin: warm and dry Neurologic: normal gait Psychological: alert and cooperative; normal mood and affect   No Known Allergies                                             History reviewed. No pertinent past medical history.  Social History   Socioeconomic History   Marital status: Married    Spouse name: Not on file   Number of children: Not on file   Years of education: Not on file   Highest education level: Not on file  Occupational History   Not on file  Tobacco Use   Smoking status: Former    Types: Cigarettes   Smokeless tobacco: Current    Types: Chew  Vaping Use   Vaping Use: Never used  Substance and Sexual Activity   Alcohol use: Yes   Drug use: Never   Sexual activity: Not on file  Other Topics Concern   Not on file  Social History Narrative   Not on file   Social Determinants of Health   Financial Resource Strain: Not on file  Food Insecurity: Not on file  Transportation Needs: Not on file  Physical Activity: Not on file  Stress: Not on file  Social Connections: Not on file  Intimate Partner Violence: Not on file    History reviewed. No pertinent family history.   Mardella Layman, MD 01/26/22 [phone]

## 2022-01-26 NOTE — Discharge Instructions (Signed)
Discharge Laparoscopic Surgery Instructions:  Common Complaints: Right shoulder pain is common after laparoscopic surgery. This is secondary to the gas used in the surgery being trapped under the diaphragm.  Walk to help your body absorb the gas. This will improve in a few days. Pain at the port sites are common, especially the larger port sites. This will improve with time.  Some nausea is common and poor appetite. The main goal is to stay hydrated the first few days after surgery.   Diet/ Activity: Diet as tolerated. You may not have an appetite, but it is important to stay hydrated. Drink 64 ounces of water a day. Your appetite will return with time.  Shower per your regular routine daily.  Do not take hot showers. Take warm showers that are less than 10 minutes. Rest and listen to your body, but do not remain in bed all day.  Walk everyday for at least 15-20 minutes. Deep cough and move around every 1-2 hours in the first few days after surgery.  Do not lift > 10 lbs, perform excessive bending, pushing, pulling, squatting for 1-2 weeks after surgery.  Do not pick at the dermabond glue on your incision sites.  This glue film will remain in place for 1-2 weeks and will start to peel off.  Do not place lotions or balms on your incision unless instructed to specifically by Dr. Aaryanna Hyden.   Pain Expectations and Narcotics: -After surgery you will have pain associated with your incisions and this is normal. The pain is muscular and nerve pain, and will get better with time. -You are encouraged and expected to take non narcotic medications like tylenol and ibuprofen (when able) to treat pain as multiple modalities can aid with pain treatment. -Narcotics are only used when pain is severe or there is breakthrough pain. -You are not expected to have a pain score of 0 after surgery, as we cannot prevent pain. A pain score of 3-4 that allows you to be functional, move, walk, and tolerate some activity is  the goal. The pain will continue to improve over the days after surgery and is dependent on your surgery. -Due to  law, we are only able to give a certain amount of pain medication to treat post operative pain, and we only give additional narcotics on a patient by patient basis.  -For most laparoscopic surgery, studies have shown that the majority of patients only need 10-15 narcotic pills, and for open surgeries most patients only need 15-20.   -Having appropriate expectations of pain and knowledge of pain management with non narcotics is important as we do not want anyone to become addicted to narcotic pain medication.  -Using ice packs in the first 48 hours and heating pads after 48 hours, wearing an abdominal binder (when recommended), and using over the counter medications are all ways to help with pain management.   -Simple acts like meditation and mindfulness practices after surgery can also help with pain control and research has proven the benefit of these practices.  Medication: Take tylenol and ibuprofen as needed for pain control, alternating every 4-6 hours.  Example:  Tylenol 1000mg @ 6am, 12noon, 6pm, 12midnight (Do not exceed 4000mg of tylenol a day). Ibuprofen 800mg @ 9am, 3pm, 9pm, 3am (Do not exceed 3600mg of ibuprofen a day).  Take Roxicodone for breakthrough pain every 4 hours.  Take Colace for constipation related to narcotic pain medication. If you do not have a bowel movement in 2 days, take Miralax   over the counter.  Drink plenty of water to also prevent constipation.   Contact Information: If you have questions or concerns, please call our office, [phone], Monday- Thursday 8AM-5PM and Friday 8AM-12Noon.  If it is after hours or on the weekend, please call [facility] Main Number, [phone], [phone], and ask to speak to the surgeon on call for Dr. Mathhew Buysse at [place].   

## 2022-01-26 NOTE — Progress Notes (Signed)
Patient arrived back from surgery with stable vitals. Completely alert and oriented, no complaints of pain, no n/v.

## 2022-01-26 NOTE — Progress Notes (Signed)
Pt transported via WC to pre-op area by OR staff.

## 2022-01-26 NOTE — Discharge Summary (Signed)
Physician Discharge Summary  Patient ID: Roberto Bullock MRN: [id number] DOB/AGE: 1986-02-01 36 y.o.  Admit date: 01/25/2022 Discharge date: 01/26/2022  Admission Diagnoses: Acute appendicitis   Discharge Diagnoses:  Principal Problem:   Acute appendicitis   Discharged Condition: good  Hospital Course: Roberto Bullock is a 36 yo who comes in with RLQ pain and findings of acute appendicitis. He was admitted and given antibiotics and taken to the OR for laparoscopic appendectomy. He was doing well post operatively and was discharged home.   Consults: None  Significant Diagnostic Studies: CT with appendicitis   Treatments: Laparoscopic appendectomy 01/26/2022   Discharge Exam: Blood pressure 122/71, pulse 74, temperature 97.6 F (36.4 C), resp. rate 16, height 6' (1.829 m), weight 91.5 kg, SpO2 100 %.   Disposition: Discharge disposition: 01-Home or Self Care       Discharge Instructions     Call MD for:  difficulty breathing, headache or visual disturbances   Complete by: As directed    Call MD for:  persistant dizziness or light-headedness   Complete by: As directed    Call MD for:  persistant nausea and vomiting   Complete by: As directed    Call MD for:  redness, tenderness, or signs of infection (pain, swelling, redness, odor or green/yellow discharge around incision site)   Complete by: As directed    Call MD for:  severe uncontrolled pain   Complete by: As directed    Call MD for:  temperature >100.4   Complete by: As directed    Increase activity slowly   Complete by: As directed       Allergies as of 01/26/2022   No Known Allergies      Medication List     TAKE these medications    ibuprofen 200 MG tablet Commonly known as: ADVIL Take 600 mg by mouth every 6 (six) hours as needed for moderate pain.   ondansetron 4 MG tablet Commonly known as: Zofran Take 1 tablet (4 mg total) by mouth every 8 (eight) hours as needed.   oxyCODONE 5 MG immediate  release tablet Commonly known as: Oxy IR/ROXICODONE Take 1 tablet (5 mg total) by mouth every 4 (four) hours as needed for severe pain or breakthrough pain.        Follow-up Information     Lucretia Roers, MD Follow up on 02/09/2022.   Specialty: General Surgery Why: post op phone call Contact information: [address] [place] [address] [phone]                 Signed: Lucretia Roers 01/26/2022, 2:12 PM

## 2022-01-26 NOTE — Anesthesia Procedure Notes (Signed)
Procedure Name: Intubation Date/Time: 01/26/2022 7:52 AM  Performed by: Marny Lowenstein, CRNAPre-anesthesia Checklist: Patient identified, Emergency Drugs available, Suction available and Patient being monitored Patient Re-evaluated:Patient Re-evaluated prior to induction Oxygen Delivery Method: Circle system utilized Preoxygenation: Pre-oxygenation with 100% oxygen Induction Type: IV induction Ventilation: Mask ventilation without difficulty Laryngoscope Size: Miller and 2 Grade View: Grade I Tube type: Oral Tube size: 7.5 mm Number of attempts: 1 Airway Equipment and Method: Patient positioned with wedge pillow and Stylet Placement Confirmation: ETT inserted through vocal cords under direct vision, positive ETCO2 and breath sounds checked- equal and bilateral Secured at: 23 cm Tube secured with: Tape Dental Injury: Teeth and Oropharynx as per pre-operative assessment

## 2022-01-26 NOTE — Transfer of Care (Signed)
Immediate Anesthesia Transfer of Care Note  Patient: Roberto Bullock  Procedure(s) Performed: APPENDECTOMY LAPAROSCOPIC (Abdomen)  Patient Location: PACU  Anesthesia Type:General  Level of Consciousness: drowsy and patient cooperative  Airway & Oxygen Therapy: Patient Spontanous Breathing  Post-op Assessment: Report given to RN and Post -op Vital signs reviewed and stable  Post vital signs: Reviewed and stable  Last Vitals:  Vitals Value Taken Time  BP 131/87 01/26/22 0845  Temp 36.9 C 01/26/22 0844  Pulse 78 01/26/22 0850  Resp 13 01/26/22 0850  SpO2 96 % 01/26/22 0850  Vitals shown include unvalidated device data.  Last Pain:  Vitals:   01/26/22 0844  TempSrc:   PainSc: 0-No pain      Patients Stated Pain Goal: 6 (01/26/22 2951)  Complications: No notable events documented.

## 2022-01-26 NOTE — Progress Notes (Signed)
[facility]  Updated family. Can go home later today if feeling ok.  Roberto Greenhouse, MD [facility] [address] [place], [place] [address] [phone] (office)

## 2022-01-26 NOTE — H&P (Signed)
[facility] History and Physical  Reason for Referral: Acute appendicitis  Referring Physician: Dr. Particia Nearing  Chief Complaint   Abdominal Pain     Roberto Bullock is a 36 y.o. male.  HPI: Roberto Bullock is a healthy patient with reported abdominal pain since yesterday evening and no complaints of nausea or vomiting. He was seen at [facility] and sent to ED. He was found to have appendicitis on his CT scan. He has no other medical issues an the ED helped place orders for admission overnight for plans for OR today.   History reviewed. No pertinent past medical history.  History reviewed. No pertinent surgical history.  History reviewed. No pertinent family history.  Social History   Tobacco Use   Smoking status: Former    Types: Cigarettes   Smokeless tobacco: Current    Types: Chew  Vaping Use   Vaping Use: Never used  Substance Use Topics   Alcohol use: Yes   Drug use: Never    Medications: I have reviewed the patient's current medications. Prior to Admission:  Medications Prior to Admission  Medication Sig Dispense Refill Last Dose   ibuprofen (ADVIL) 200 MG tablet Take 600 mg by mouth every 6 (six) hours as needed for moderate pain.   unknown   Scheduled:  chlorhexidine  1 Application Topical Once   Continuous:  cefoTEtan (CEFOTAN) IV     lactated ringers     LFY:BOFBPZWCHENID (DILAUDID) injection, ondansetron (ZOFRAN) IV  No Known Allergies    ROS:  A comprehensive review of systems was negative except for: Gastrointestinal: positive for abdominal pain  Blood pressure 107/67, pulse 75, temperature 98.4 F (36.9 C), resp. rate 18, height 6' (1.829 m), weight 91.5 kg, SpO2 99 %. Physical Exam Constitutional:      Appearance: He is well-developed.  HENT:     Head: Normocephalic.  Cardiovascular:     Rate and Rhythm: Normal rate.  Pulmonary:     Effort: Pulmonary effort is normal.  Abdominal:     General: There is distension.     Tenderness:  There is abdominal tenderness in the right lower quadrant.  Musculoskeletal:     Comments: Moves all extremities   Skin:    General: Skin is warm.  Neurological:     General: No focal deficit present.     Mental Status: He is alert and oriented to person, place, and time.     Results: Results for orders placed or performed during the hospital encounter of 01/25/22 (from the past 48 hour(s))  Lipase, blood     Status: None   Collection Time: 01/25/22  7:38 PM  Result Value Ref Range   Lipase 26 11 - 51 U/L    Comment: Performed at [facility], [address] [place], [place] [address]  Comprehensive metabolic panel     Status: Abnormal   Collection Time: 01/25/22  7:38 PM  Result Value Ref Range   Sodium 137 135 - 145 mmol/L   Potassium 3.7 3.5 - 5.1 mmol/L   Chloride 103 98 - 111 mmol/L   CO2 25 22 - 32 mmol/L   Glucose, Bld 86 70 - 99 mg/dL    Comment: Glucose reference range applies only to samples taken after fasting for at least 8 hours.   BUN 12 6 - 20 mg/dL   Creatinine, Ser 3.53 0.61 - 1.24 mg/dL   Calcium 9.1 8.9 - 61.4 mg/dL   Total Protein 7.7 6.5 - 8.1 g/dL  Albumin 4.2 3.5 - 5.0 g/dL   AST 14 (L) 15 - 41 U/L   ALT 16 0 - 44 U/L   Alkaline Phosphatase 71 38 - 126 U/L   Total Bilirubin 1.4 (H) 0.3 - 1.2 mg/dL   GFR, Estimated >48 >18 mL/min    Comment: (NOTE) Calculated using the CKD-EPI Creatinine Equation (2021)    Anion gap 9 5 - 15    Comment: Performed at [facility], [address], [place], [place] [address]  CBC     Status: Abnormal   Collection Time: 01/25/22  7:38 PM  Result Value Ref Range   WBC 11.0 (H) 4.0 - 10.5 K/uL   RBC 4.83 4.22 - 5.81 MIL/uL   Hemoglobin 15.4 13.0 - 17.0 g/dL   HCT 97.0 26.3 - 78.5 %   MCV 90.5 80.0 - 100.0 fL   MCH 31.9 26.0 - 34.0 pg   MCHC 35.2 30.0 - 36.0 g/dL   RDW 88.5 02.7 - 74.1 %   Platelets 219 150 - 400 K/uL   nRBC 0.0 0.0 - 0.2 %    Comment: Performed at [facility], [address] [place], [place]  [address]  Urinalysis, Routine w reflex microscopic Urine, Clean Catch     Status: Abnormal   Collection Time: 01/25/22  7:57 PM  Result Value Ref Range   Color, Urine YELLOW YELLOW   APPearance CLEAR CLEAR   Specific Gravity, Urine 1.017 1.005 - 1.030   pH 6.0 5.0 - 8.0   Glucose, UA NEGATIVE NEGATIVE mg/dL   Hgb urine dipstick NEGATIVE NEGATIVE   Bilirubin Urine NEGATIVE NEGATIVE   Ketones, ur 80 (A) NEGATIVE mg/dL   Protein, ur NEGATIVE NEGATIVE mg/dL   Nitrite NEGATIVE NEGATIVE   Leukocytes,Ua NEGATIVE NEGATIVE    Comment: Performed at [facility], [address] [place], [place] [address]  Surgical pcr screen     Status: None   Collection Time: 01/25/22 11:04 PM   Specimen: Nasal Mucosa; Nasal Swab  Result Value Ref Range   MRSA, PCR NEGATIVE NEGATIVE   Staphylococcus aureus NEGATIVE NEGATIVE    Comment: (NOTE) The Xpert SA Assay (FDA approved for NASAL specimens in patients 55 years of age and older), is one component of a comprehensive surveillance program. It is not intended to diagnose infection nor to guide or monitor treatment. Performed at [facility], [address], [place], [place] [address]    Personally reviewed- dilated and inflamed appendix curling up laterally CT ABDOMEN PELVIS W CONTRAST  Result Date: 01/25/2022 CLINICAL DATA:  Right lower quadrant pain for 2 days, initial encounter EXAM: CT ABDOMEN AND PELVIS WITH CONTRAST TECHNIQUE: Multidetector CT imaging of the abdomen and pelvis was performed using the standard protocol following bolus administration of intravenous contrast. RADIATION DOSE REDUCTION: This exam was performed according to the departmental dose-optimization program which includes automated exposure control, adjustment of the mA and/or kV according to patient size and/or use of iterative reconstruction technique. CONTRAST:  OMNIPAQUE IOHEXOL 300 MG/ML  SOLN COMPARISON:  None Available. FINDINGS: Lower chest: No acute abnormality.  Hepatobiliary: No focal liver abnormality is seen. No gallstones, gallbladder wall thickening, or biliary dilatation. Pancreas: Unremarkable. No pancreatic ductal dilatation or surrounding inflammatory changes. Spleen: Normal in size without focal abnormality. Adrenals/Urinary Tract: Adrenal glands are within normal limits. Kidneys demonstrate a normal enhancement pattern bilaterally. No renal calculi are seen. The ureters are within normal limits. The bladder is well distended. Stomach/Bowel: No obstructive or inflammatory changes of the colon  are noted. The appendix is hyperemic and significantly dilated to 14 mm. Peri appendiceal inflammatory changes are seen consistent with acute appendicitis. No perforation or abscess is noted at this time. Small bowel and stomach are within normal limits. Vascular/Lymphatic: No significant vascular findings are present. No enlarged abdominal or pelvic lymph nodes. Reproductive: Prostate is within normal limits. Other: No abdominal wall hernia or abnormality. No abdominopelvic ascites. Musculoskeletal: No acute or significant osseous findings. IMPRESSION: Changes consistent with acute appendicitis without perforation or abscess formation. Electronically Signed   By: Alcide Clever M.D.   On: 01/25/2022 21:37     Assessment & Plan:  Roberto Bullock is a 36 y.o. male with acute appendicitis.  -Discussed the risk of laparoscopic appendectomy and the option of antibiotics alone. Discussed that in [place] and some trials in the [place], antibiotics are used for simple appendicitis. Discussed that research shows a 40% failure rate for antibiotics alone.  Discussed risk of surgery including but not limited to bleeding, infection, injury to other organs, normal appendix, and after this discussion the patient has decided to proceed with surgery.    Lucretia Roers 01/26/2022, 7:01 AM

## 2022-01-26 NOTE — Op Note (Signed)
[facility]  Date of Surgery: 01/26/2022  Admit Date: 01/25/2022   Performing Service: General  Surgeon(s) and Role:    * Lucretia Roers, MD - Primary   Pre-operative Diagnosis: Acute Appendicitis  Post-operative Diagnosis: Acute Appendicitis  Procedure Performed: Laparoscopic Appendectomy   Surgeon: Leatrice Jewels. Henreitta Leber, MD   Assistant: No qualified resident was available.   Anesthesia: General   Findings:  The appendix was found to be inflamed. There were no signs of necrosis. There was not perforation. There was not abscess formation.   Estimated Blood Loss: Minimal   Specimens:  ID Type Source Tests Collected by Time Destination  1 : Appendix Tissue PATH Soft tissue SURGICAL PATHOLOGY Lucretia Roers, MD 01/26/2022 [phone]      Complications: None; patient tolerated the procedure well.   Disposition: PACU - hemodynamically stable.   Condition: stable   Indications: The patient presented with a 2 day history of right-sided abdominal pain. A CT revealed findings consistent with acute appendicitis.   Procedure Details  Prior to the procedure, the risks, benefits, complications, treatment options, and expected outcomes were discussed with the patient and/or family, including but not limited to the risk of bleeding, infection, finding of a normal appendix, and the need for conversion to an open procedure. There was concurrence with the proposed plan and informed consent was obtained. The patient was taken to the operating room, identified as Roberto Bullock and the procedure verified as Laproscopic Appendectomy.    The patient was placed in the supine position and general anesthesia was induced, along with placement of orogastric tube, SCD's, and a Foley catheter. The abdomen was prepped and draped in a sterile fashion. The abdomen was entered with Veress technique in the infraumbilical incision. Intraperitoneal placement was confirmed with saline drop, low  entry pressures, and easy insufflation. A 11 mm optiview trocar was placed under direct visualization with a 0 degree scope. The 10 mm 0 degree scope was placed in the abdomen and no evidence of injury was identified. A 12 mm port was placed in the left lower quadrant of the abdomen after skin incision with trocar placement under direct vision. A careful evaluation of the entire abdomen was carried out. An additional 5 mm port was placed in the suprapubic area under direct vision.  The patient was placed in Trendelenburg and left lateral decubitus position. The small intestines were retracted in the cephalad and left lateral direction away from the pelvis and right lower quadrant. The patient was found to have an dilated and inflamed appendix. There was not evidence of perforation.   The appendix was carefully dissected. A window was made in the mesoappendix at the base of the appendix. The appendix was divided at its base using a standard endo-GIA stapler.  The staple line was inspected and noted to be within normal limits. The mesoappendix was taken with the harmonic energy device. The appendix was placed within an Endocatch specimen bag. There was no evidence of bleeding, leakage, or complication after division of the appendix.  Any remaining blood or pus was suctioned out from the abdomen, hemostasis was confirmed. The endocatch bag was removed via the 12 mm port, then the abdomen desufflated. The appendix was passed off the field as a specimen.   The the 12 mm and 10 mm port sites were closed with a 0 Vicryl suture. The trocar site skin wounds were closed using subcuticular 4-0 Monocryl suture and dermabond. The patient was then awakened from general anesthesia,  extubated, and taken to PACU for recovery.   Instrument, sponge, and needle counts were correct at the conclusion of the case.   Roberto Greenhouse, MD [facility] [address] [place], [place]  [id number] [phone]([id number])

## 2022-01-26 NOTE — Anesthesia Preprocedure Evaluation (Signed)
Anesthesia Evaluation  Patient identified by MRN, date of birth, ID band Patient awake    Reviewed: Allergy & Precautions, NPO status , Patient's Chart, lab work & pertinent test results  Airway Mallampati: II  TM Distance: >3 FB Neck ROM: Full    Dental  (+) Dental Advisory Given, Teeth Intact   Pulmonary former smoker,    Pulmonary exam normal breath sounds clear to auscultation       Cardiovascular Exercise Tolerance: Good negative cardio ROS Normal cardiovascular exam Rhythm:Regular Rate:Normal     Neuro/Psych negative neurological ROS  negative psych ROS   GI/Hepatic negative GI ROS, Neg liver ROS,   Endo/Other  negative endocrine ROS  Renal/GU negative Renal ROS  negative genitourinary   Musculoskeletal negative musculoskeletal ROS (+)   Abdominal   Peds negative pediatric ROS (+)  Hematology negative hematology ROS (+)   Anesthesia Other Findings   Reproductive/Obstetrics negative OB ROS                            Anesthesia Physical Anesthesia Plan  ASA: 2  Anesthesia Plan: General   Post-op Pain Management: Dilaudid IV   Induction: Intravenous  PONV Risk Score and Plan: 4 or greater and Ondansetron, Dexamethasone and Midazolam  Airway Management Planned: Oral ETT  Additional Equipment:   Intra-op Plan:   Post-operative Plan: Extubation in OR  Informed Consent: I have reviewed the patients History and Physical, chart, labs and discussed the procedure including the risks, benefits and alternatives for the proposed anesthesia with the patient or authorized representative who has indicated his/her understanding and acceptance.     Dental advisory given  Plan Discussed with: CRNA and Surgeon  Anesthesia Plan Comments:         Anesthesia Quick Evaluation

## 2022-01-26 NOTE — Anesthesia Postprocedure Evaluation (Signed)
Anesthesia Post Note  Patient: Roberto Bullock  Procedure(s) Performed: APPENDECTOMY LAPAROSCOPIC (Abdomen)  Patient location during evaluation: PACU Anesthesia Type: General Level of consciousness: awake and alert and oriented Pain management: pain level controlled Vital Signs Assessment: post-procedure vital signs reviewed and stable Respiratory status: spontaneous breathing, nonlabored ventilation and respiratory function stable Cardiovascular status: blood pressure returned to baseline and stable Postop Assessment: no apparent nausea or vomiting Anesthetic complications: no   No notable events documented.   Last Vitals:  Vitals:   01/26/22 0916 01/26/22 0951  BP: 125/73 122/71  Pulse: 78 74  Resp: 14 16  Temp: 36.7 C 36.4 C  SpO2: 96% 100%    Last Pain:  Vitals:   01/26/22 0951  TempSrc:   PainSc: 0-No pain                 Gael Delude C Yitzchok Carriger

## 2022-01-26 NOTE — Progress Notes (Signed)
IV removed discharge instructions reviewed.  Scripts sent to pharmacy.  To follow up with Dr. Henreitta Leber on Aug 31.  Transported by [facility] to ED entrance and significant other.

## 2022-01-27 LAB — SURGICAL PATHOLOGY

## 2022-01-30 ENCOUNTER — Encounter (HOSPITAL_COMMUNITY): Payer: Self-pay | Admitting: General Surgery

## 2022-02-09 ENCOUNTER — Ambulatory Visit (INDEPENDENT_AMBULATORY_CARE_PROVIDER_SITE_OTHER): Payer: 59 | Admitting: General Surgery

## 2022-02-09 DIAGNOSIS — K353 Acute appendicitis with localized peritonitis, without perforation or gangrene: Secondary | ICD-10-CM

## 2022-02-09 NOTE — Progress Notes (Signed)
[facility]  I am calling the patient for post operative evaluation. This is not a billable encounter as it is under the global charges for the surgery.  The patient had a 01/26/22 on laparoscopic appendectomy. The patient reports that he is doing well. The are tolerating a diet, having good pain control, and having regular Bms.  The incisions are healing and minimal bruising. The patient has no concerns.   Pathology: FINAL MICROSCOPIC DIAGNOSIS:   A. APPENDIX, APPENDECTOMY:  - Acute appendicitis  Will see the patient PRN.  Diet and activity as tolerated.   Algis Greenhouse, MD [facility] [address] [place], [place] [address] [phone] (office)

## 2023-04-16 ENCOUNTER — Encounter (HOSPITAL_COMMUNITY): Payer: Self-pay | Admitting: General Surgery
